# Patient Record
Sex: Male | Born: 1985 | Race: White | Hispanic: No | Marital: Single | Smoking: Former smoker
Health system: Southern US, Community
[De-identification: ages and names within clinical notes are randomized; demographics above are authoritative.]

---
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
## 2014-04-02 ENCOUNTER — Ambulatory Visit: Payer: BC Managed Care – PPO | Admitting: Internal Medicine

## 2014-04-04 ENCOUNTER — Ambulatory Visit (INDEPENDENT_AMBULATORY_CARE_PROVIDER_SITE_OTHER): Payer: BC Managed Care – PPO

## 2014-04-04 ENCOUNTER — Ambulatory Visit (INDEPENDENT_AMBULATORY_CARE_PROVIDER_SITE_OTHER): Payer: BC Managed Care – PPO | Admitting: Emergency Medicine

## 2014-04-04 VITALS — BP 118/72 | HR 85 | Temp 98.3°F | Resp 16 | Wt 154.6 lb

## 2014-04-04 DIAGNOSIS — M25571 Pain in right ankle and joints of right foot: Secondary | ICD-10-CM

## 2014-04-04 DIAGNOSIS — S93409A Sprain of unspecified ligament of unspecified ankle, initial encounter: Secondary | ICD-10-CM

## 2014-04-04 DIAGNOSIS — M25579 Pain in unspecified ankle and joints of unspecified foot: Secondary | ICD-10-CM

## 2014-04-04 NOTE — Progress Notes (Signed)
Patient  fit and trained for right ankle [place]

## 2014-04-04 NOTE — Progress Notes (Signed)
   Subjective:    Patient ID: Austin Robinson, male    DOB: [birth year], 28 y.o.   MRN: [id number]  HPI [age] year old male presents for evaluation of right ankle pain s/p an injury on 04/01/14. He was walking down the stairs at his house and tripped causing his right foot to hyper-plantar flex.  States he had immediate pain and has difficulty bearing weight since then.  He has a set of crutches that at home that he has been using which helps.  Does admit the pain has somewhat improved, but he admits it feels worse than any sprain he has had in the past. No hx of prior fracture. Admits to throbbing pain and tingling but no numbness or weakness. Has taken tylenol that has not helped much. He has been icing intermittently.    Review of Systems  Musculoskeletal: Positive for joint swelling.  Skin: Negative for color change and wound.  Neurological: Negative for weakness and numbness.       Objective:   Physical Exam  Constitutional: He is oriented to person, place, and time. He appears well-developed and well-nourished.  HENT:  Head: Normocephalic and atraumatic.  Right Ear: External ear normal.  Left Ear: External ear normal.  Eyes: Conjunctivae are normal.  Neck: Normal range of motion.  Cardiovascular: Normal rate.   Pulmonary/Chest: Effort normal.  Musculoskeletal:       Right ankle: He exhibits normal range of motion, no swelling and no ecchymosis. Tenderness (anterior foot). No lateral malleolus, no medial malleolus and no head of 5th metatarsal tenderness found. Achilles tendon normal.  Capillary refill normal <2 seconds. 5/5 strength.   Neurological: He is alert and oriented to person, place, and time.  Psychiatric: He has a normal mood and affect. His behavior is normal. Judgment and thought content normal.         [facility] reading (PRIMARY) by  Dr. Cleta Albertsaub as possible avulsion fx of navicular.   Assessment & Plan:  Pain in joint, ankle and foot, right - Plan: DG Ankle Complete  Right  Sprain of ankle, unspecified site  Radiologist read indicates small avulsion fx of navicular bone.   Placed in [place]. Patient already has a set of crutches that he will use. RICE Recommend he be non-weight bearing until pain improves then wean to toe touch weight bearing as tolerated. Recheck if symptoms worsening or fail to improve.  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 May extend if necessary.

## 2015-01-14 IMAGING — CR DG ANKLE COMPLETE 3+V*R*
2 series · 2 of 2 positions shown · non-contrast
Comparison: None.

CLINICAL DATA: Pain post trauma

EXAM:
RIGHT ANKLE - COMPLETE 3+ VIEW

[AP]
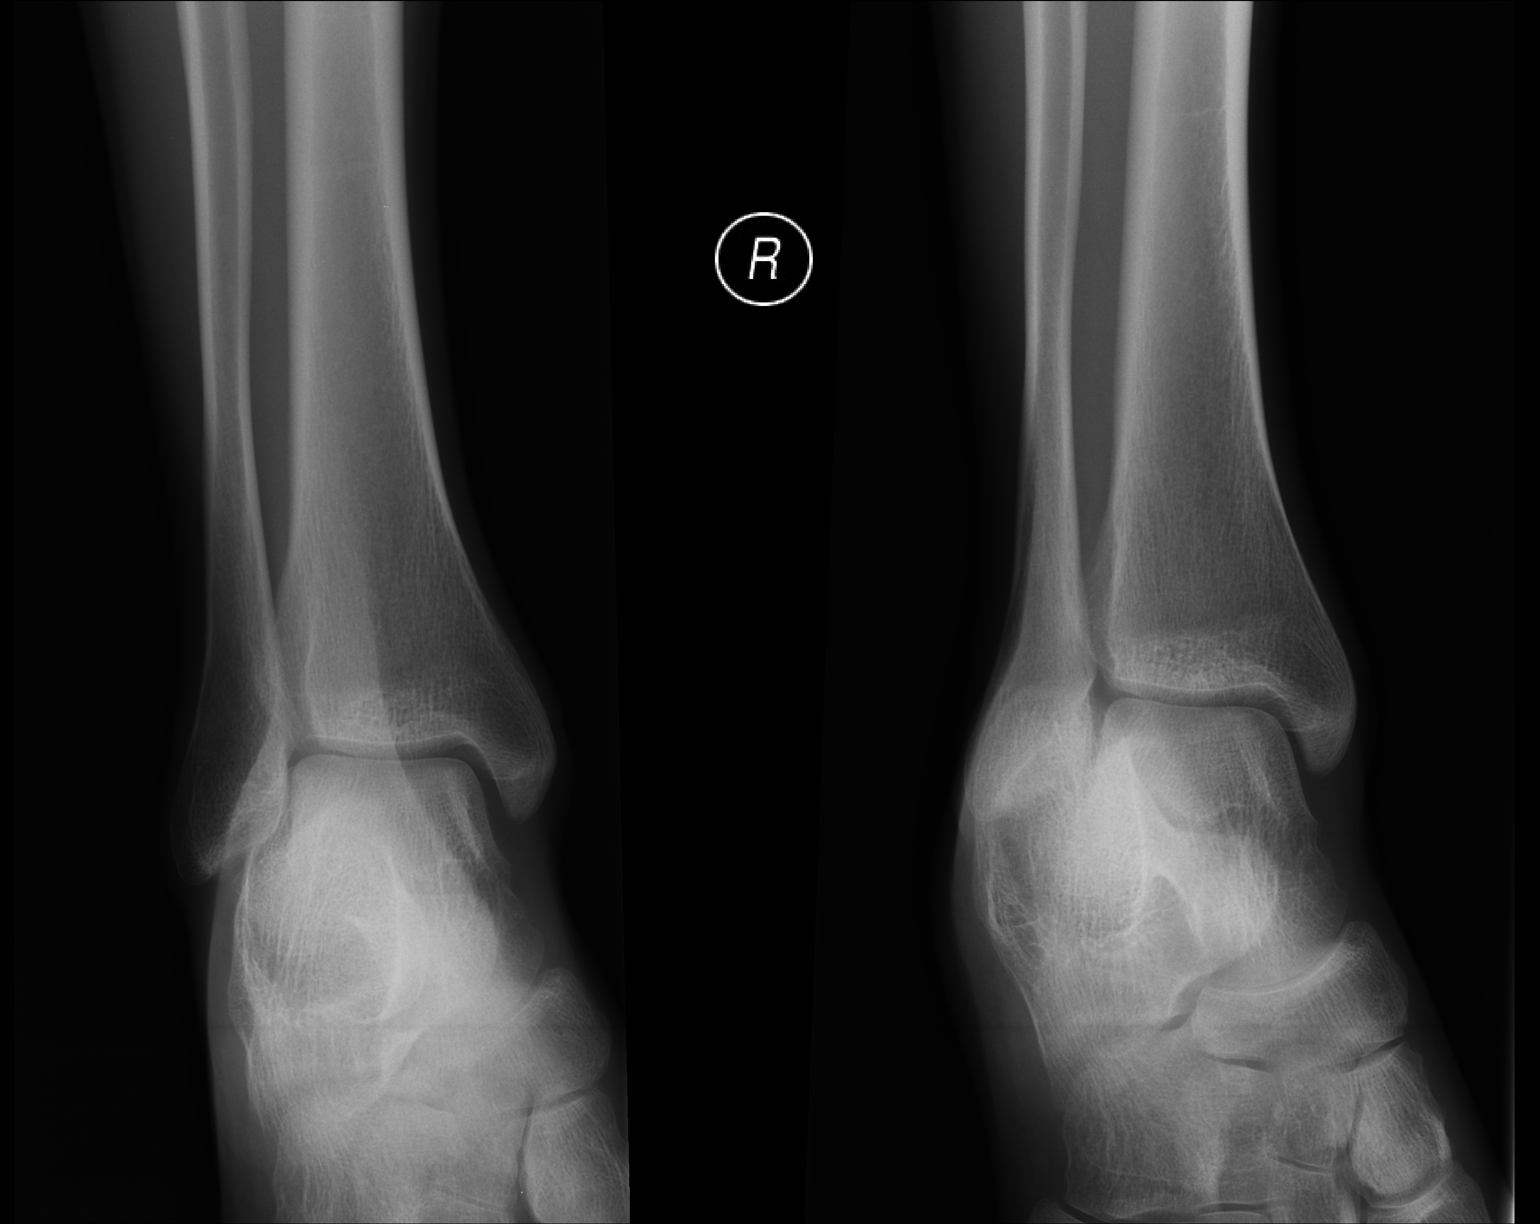

[lateral]
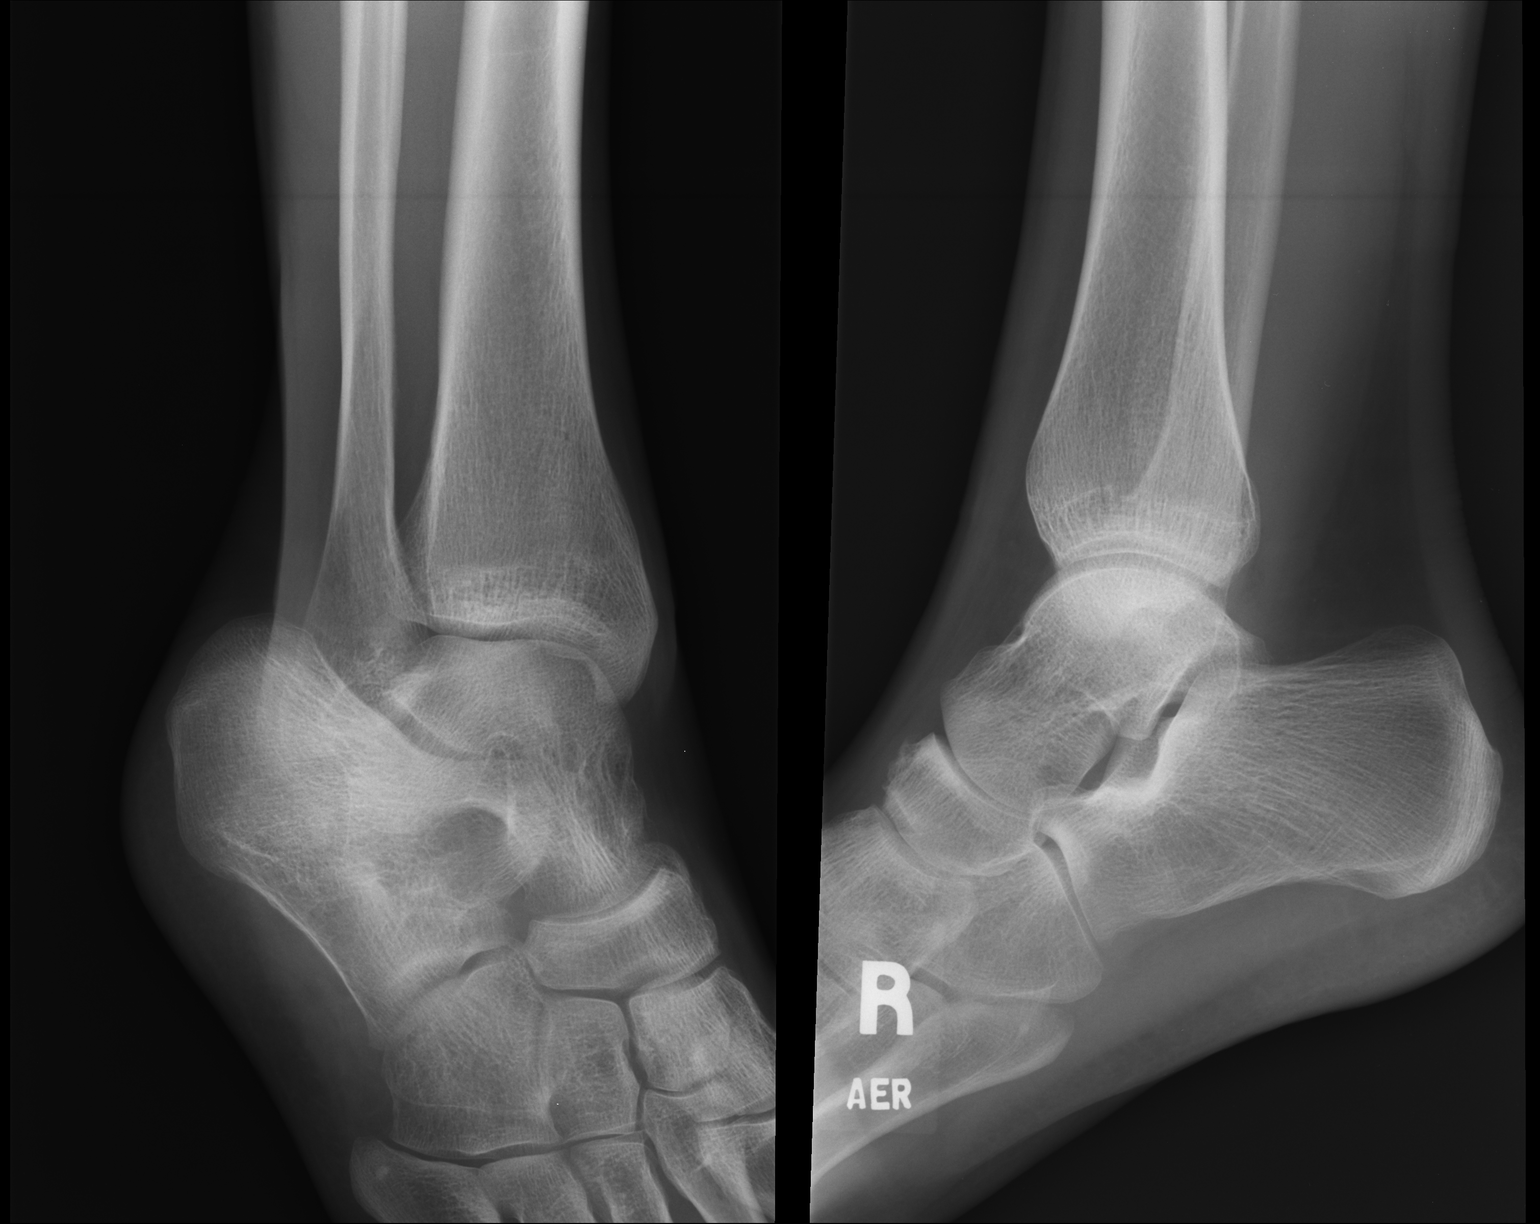

[2 of 2 positions shown; findings below may reference images not displayed]

FINDINGS: Frontal, oblique, and lateral views were obtained. There is a small
avulsion arising from the dorsal navicular. No other fracture. No
effusion. Ankle mortise appears intact. No erosive change.
IMPRESSION: Small avulsion arising from the dorsal navicular. No other fracture.
Mortise appears intact.

These results will be called to the ordering clinician or
representative by the Radiologist Assistant, and communication
documented in the PACS or zVision Dashboard.
